# Patient Record
Sex: Male | Born: 1990 | Race: White | Hispanic: No | Marital: Married | Smoking: Never smoker
Health system: Southern US, Community
[De-identification: ages and names within clinical notes are randomized; demographics above are authoritative.]

## PROBLEM LIST (undated history)

## (undated) HISTORY — PX: WISDOM TOOTH EXTRACTION: SHX21

---

## 2010-12-18 ENCOUNTER — Encounter: Payer: Self-pay | Admitting: Emergency Medicine

## 2010-12-18 ENCOUNTER — Inpatient Hospital Stay (INDEPENDENT_AMBULATORY_CARE_PROVIDER_SITE_OTHER)
Admission: RE | Admit: 2010-12-18 | Discharge: 2010-12-18 | Disposition: A | Discharge status: Home / Self Care | Payer: 59 | Source: Ambulatory Visit | Attending: Emergency Medicine | Admitting: Emergency Medicine

## 2010-12-18 DIAGNOSIS — L255 Unspecified contact dermatitis due to plants, except food: Secondary | ICD-10-CM | POA: Insufficient documentation

## 2011-05-11 NOTE — Progress Notes (Signed)
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  The next day he broke out in a rash under his R eye and on L arm and neck.  Itchy and red and a little swollen near the eye.  No visual problems or actual eye redness, no F/C.  Hasn't used any meds yet.  He is going to a concert in [place] this weekend and would like to be better by then.  REVIEW OF SYSTEMS Constitutional Symptoms      Denies fever, chills, night sweats, weight loss, weight gain, and fatigue.  Eyes       Denies change in vision, eye pain, eye discharge, glasses, contact lenses, and eye surgery. Ear/Nose/Throat/Mouth       Denies hearing loss/aids, change in hearing, ear pain, ear discharge, dizziness, frequent runny nose, frequent nose bleeds, sinus problems, sore throat, hoarseness, and tooth pain or bleeding.  Respiratory       Denies dry cough, productive cough, wheezing, shortness of breath, asthma, bronchitis, and emphysema/COPD.  Cardiovascular       Denies murmurs, chest pain, and tires easily with exhertion.    Gastrointestinal       Denies stomach pain, nausea/vomiting, diarrhea, constipation, blood in bowel movements, and indigestion. Genitourniary       Denies painful urination, blood or discharge from penis, kidney stones, and loss of urinary control. Neurological  Denies paralysis, seizures, and fainting/blackouts. Musculoskeletal       Denies muscle pain, joint pain, joint stiffness, decreased range of motion, redness, swelling, muscle weakness, and gout.  Skin       Denies bruising, unusual mles/lumps or sores, and hair/skin or nail changes.  Psych       Denies mood changes, temper/anger issues, anxiety/stress, speech problems, depression, and sleep problems. Other Comments: ?poison ivy around right eye and right arm x 3 days   Past History:  Past Medical History: Unremarkable  Past Surgical History: Denies surgical history  Family History: NA  Social History: Occupation: Theatre stage manager Never Smoked Alcohol use-no Drug use-no Smoking Status:  never Drug Use:  no Physical Exam General appearance: well developed, well nourished, no acute distress MSE: oriented to time, place, and person Scattered linear raised excoriations on R volar forearm, lower neck, and inferior to R eye.  No eye involvement or conjunctival injection. There is also mild swelling inferior to the eye, but no signs of orbital cellulitis.  OP is patent, lungs CTAB.  Assessment New Problems: POISON IVY DERMATITIS (ICD-692.6)   Plan New Medications/Changes: PREDNISONE 20 MG TABS (PREDNISONE) 1 by mouth two  times a day for5  #10 x 0, 12/18/2010, Hoyt Koch MD  New Orders: New Patient Level III [phone] Solumedrol up to 125mg  [J2930] Admin of Therapeutic Inj  intramuscular or subcutaneous [96372] Planning Comments:   Solumedrol given since poison ivy near the eye, then Rx for prednisone for 4.5 - 5 days.  Cool compresses, oral Benedryl, and avoid scratching.  Follow-up with your primary care physician or dermatology if not improving or if getting worse   The patient and/or caregiver has been counseled thoroughly with regard to medications prescribed including dosage, schedule, interactions, rationale for use, and possible side effects and they verbalize  understanding.  Diagnoses and expected course of recovery discussed and will return if not improved as expected or if the condition worsens. Patient and/or caregiver verbalized understanding.  Prescriptions: PREDNISONE 20 MG TABS (PREDNISONE) 1 by mouth two times a day for5  #10 x 0   Entered and Authorized by:   Hoyt Koch MD   Signed by:   Hoyt Koch MD on 12/18/2010   Method used:   Print then Give to Patient   RxID:   [id number]   Medication Administration  Injection # 1:    Medication: Solumedrol up to 125mg     Diagnosis: POISON IVY DERMATITIS (ICD-692.6)    Route: IM    Site: RUOQ gluteus    Exp Date: 06/08/2013    Lot #: [id number]    Mfr: Pharmacia    Patient tolerated injection without complications    Given by: Lajean Saver RN (December 18, 2010 11:06 AM)  Orders Added: 1)  New Patient Level III [99203] 2)  Solumedrol up to 125mg  [J2930] 3)  Admin of Therapeutic Inj  intramuscular or subcutaneous [57846]

## 2013-10-18 ENCOUNTER — Emergency Department
Admission: EM | Admit: 2013-10-18 | Discharge: 2013-10-18 | Disposition: A | Discharge status: Home / Self Care | Payer: 59 | Source: Home / Self Care | Attending: Family Medicine | Admitting: Family Medicine

## 2013-10-18 ENCOUNTER — Encounter: Payer: Self-pay | Admitting: Emergency Medicine

## 2013-10-18 DIAGNOSIS — L255 Unspecified contact dermatitis due to plants, except food: Secondary | ICD-10-CM

## 2013-10-18 MED ORDER — TRIAMCINOLONE ACETONIDE 40 MG/ML IJ SUSP
40.0000 mg | Freq: Once | INTRAMUSCULAR | Status: AC
  Administered 2013-10-18: 40 mg via INTRAMUSCULAR

## 2013-10-18 NOTE — Discharge Instructions (Signed)
May take Benadryl at bedtime as needed for itching.   Poison [employer] Poison ivy is a inflammation of the skin (contact dermatitis) caused by touching the allergens on the leaves of the ivy plant following previous exposure to the plant. The rash usually appears 48 hours after exposure. The rash is usually bumps (papules) or blisters (vesicles) in a linear pattern. Depending on your own sensitivity, the rash may simply cause redness and itching, or it may also progress to blisters which may break open. These must be well cared for to prevent secondary bacterial (germ) infection, followed by scarring. Keep any open areas dry, clean, dressed, and covered with an antibacterial ointment if needed. The eyes may also get puffy. The puffiness is worst in the morning and gets better as the day progresses. This dermatitis usually heals without scarring, within 2 to 3 weeks without treatment. HOME CARE INSTRUCTIONS  Thoroughly wash with soap and water as soon as you have been exposed to poison ivy. You have about one half hour to remove the plant resin before it will cause the rash. This washing will destroy the oil or antigen on the skin that is causing, or will cause, the rash. Be sure to wash under your fingernails as any plant resin there will continue to spread the rash. Do not rub skin vigorously when washing affected area. Poison ivy cannot spread if no oil from the plant remains on your body. A rash that has progressed to weeping sores will not spread the rash unless you have not washed thoroughly. It is also important to wash any clothes you have been wearing as these may carry active allergens. The rash will return if you wear the unwashed clothing, even several days later. Avoidance of the plant in the future is the best measure. Poison ivy plant can be recognized by the number of leaves. Generally, poison ivy has three leaves with flowering branches on a single stem. Diphenhydramine may be purchased over the  counter and used as needed for itching. Do not drive with this medication if it makes you drowsy.Ask your caregiver about medication for children. SEEK MEDICAL CARE IF:  Open sores develop.  Redness spreads beyond area of rash.  You notice purulent (pus-like) discharge.  You have increased pain.  Other signs of infection develop (such as fever). Document Released: 05/22/2000 Document Revised: 08/17/2011 Document Reviewed: 04/10/2009 [facility] Patient Information 2014 [employer] [place].

## 2013-10-18 NOTE — ED Notes (Signed)
Brad c/o poison oak rash to bilateral arms x 5 days. Applied Clorox.

## 2013-10-18 NOTE — ED Provider Notes (Signed)
CSN: [id number]     Arrival date & time 10/18/13  1622 History   First MD Initiated Contact with Patient 10/18/13 1648     Chief Complaint  Patient presents with  . Poison Oak      HPI Comments: Patient came into contact with poison ivy one week ago, and has persistent pruritic rash on arms.  Patient is a 23 y.o. male presenting with rash. The history is provided by the patient.  Rash Pain location: arms. Pain quality comment:  Itching Pain severity:  Mild Onset quality:  Gradual Duration:  1 week Timing:  Constant Progression:  Improving Chronicity:  New Context comment:  Poison ivy contact Relieved by:  Nothing Worsened by:  Nothing tried Ineffective treatments: Clorox. Associated symptoms: no chills, no fever and no sore throat     History reviewed. No pertinent past medical history. Past Surgical History  Procedure Laterality Date  . Wisdom tooth extraction     History reviewed. No pertinent family history. History  Substance Use Topics  . Smoking status: Never Smoker   . Smokeless tobacco: Current User    Types: Snuff  . Alcohol Use: Yes    Review of Systems  Constitutional: Negative for fever and chills.  HENT: Negative for sore throat.   Skin: Positive for rash.    Allergies  Review of patient's allergies indicates no known allergies.  Home Medications   Prior to Admission medications   Not on File   BP 116/67  Pulse 57  Temp(Src) 98.2 F (36.8 C) (Oral)  Resp 14  Wt 171 lb (77.565 kg)  SpO2 97% Physical Exam  Nursing note and vitals reviewed. Constitutional: He is oriented to person, place, and time. He appears well-developed and well-nourished. No distress.  HENT:  Head: Atraumatic.  Eyes: Conjunctivae are normal. Pupils are equal, round, and reactive to light.  Neurological: He is alert and oriented to person, place, and time.  Skin: Skin is warm and dry. Rash noted.     There is erythema on both arms, scattered small vesicles, and  areas of linear erythema as noted on diagram.      ED Course  Procedures  none      MDM   1. Rhus dermatitis    Kenalog 40mg  IM  May take Benadryl at bedtime as needed for itching. Call if not improved about 4 days (could add prednisone)    Lattie HawStephen A Beese, MD 10/18/13 1705

## 2014-08-06 ENCOUNTER — Emergency Department
Admission: EM | Admit: 2014-08-06 | Discharge: 2014-08-06 | Disposition: A | Discharge status: Home / Self Care | Payer: 59 | Source: Home / Self Care | Attending: Family Medicine | Admitting: Family Medicine

## 2014-08-06 ENCOUNTER — Encounter: Payer: Self-pay | Admitting: *Deleted

## 2014-08-06 DIAGNOSIS — J209 Acute bronchitis, unspecified: Secondary | ICD-10-CM

## 2014-08-06 MED ORDER — AZITHROMYCIN 250 MG PO TABS: ORAL_TABLET | ORAL | Status: DC

## 2014-08-06 NOTE — ED Provider Notes (Signed)
CSN: [id number]     Arrival date & time 08/06/14  0831 History   First MD Initiated Contact with Patient 08/06/14 [phone]     Chief Complaint  Patient presents with  . Hoarse  . Cough      HPI Comments: Two weeks ago patient developed typical cold-like symptoms including mild sore throat, sinus congestion, headache, and fatigue, but no cough.  These symptoms almost cleared, and then one week ago recurred with development of a non-productive cough.  No fevers, chills, and sweats.   The history is provided by the patient.    History reviewed. No pertinent past medical history. Past Surgical History  Procedure Laterality Date  . Wisdom tooth extraction     History reviewed. No pertinent family history. History  Substance Use Topics  . Smoking status: Never Smoker   . Smokeless tobacco: Current User    Types: Snuff  . Alcohol Use: Yes    Review of Systems + sore throat + cough No pleuritic pain No wheezing + nasal congestion + post-nasal drainage No sinus pain/pressure No itchy/red eyes No earache No hemoptysis No SOB No fever/chills No nausea No vomiting No abdominal pain No diarrhea No urinary symptoms No skin rash No fatigue No myalgias No headache Used OTC meds without relief  Allergies  Review of patient's allergies indicates no known allergies.  Home Medications   Prior to Admission medications   Medication Sig Start Date End Date Taking? Authorizing Provider  azithromycin (ZITHROMAX Z-PAK) 250 MG tablet Take 2 tabs today; then begin one tab once daily for 4 more days. 08/06/14   Lattie HawStephen A George Haggart, MD   BP 119/68 mmHg  Pulse 66  Temp(Src) 97.9 F (36.6 C) (Oral)  Resp 16  Ht 5\' 10"  (1.778 m)  Wt 182 lb (82.555 kg)  BMI 26.11 kg/m2  SpO2 98% Physical Exam Nursing notes and Vital Signs reviewed. Appearance:  Patient appears stated age, and in no acute distress Eyes:  Pupils are equal, round, and reactive to light and accomodation.  Extraocular movement  is intact.  Conjunctivae are not inflamed  Ears:  Canals normal.  Tympanic membranes normal.  Nose:  Mildly congested turbinates.  No sinus tenderness.    Pharynx:  Normal Neck:  Supple.  Nontender enlarged posterior nodes are palpated bilaterally  Lungs:  Clear to auscultation.  Breath sounds are equal.  Heart:  Regular rate and rhythm without murmurs, rubs, or gallops.  Abdomen:  Nontender without masses or hepatosplenomegaly.  Bowel sounds are present.  No CVA or flank tenderness.  Extremities:  No edema.  No calf tenderness Skin:  No rash present.   ED Course  Procedures  none   MDM   1. Acute bronchitis, unspecified organism    Begin Z-pack for atypical coverage. Take plain guaifenesin (1200mg  extended release tabs such as Mucinex) twice daily, with plenty of water, for cough and congestion.  May add Pseudoephedrine for sinus congestion.  Get adequate rest.   May use Afrin nasal spray (or generic oxymetazoline) twice daily for about 5 days.  Also recommend using saline nasal spray several times daily and saline nasal irrigation (AYR is a common brand).  May take Delsym Cough Suppressant at bedtime for nighttime cough.  Stop all antihistamines for now, and other non-prescription cough/cold preparations.   Follow-up with family doctor if not improving about one week.     Lattie HawStephen A Rocquel Askren, MD 08/06/14 [phone]

## 2014-08-06 NOTE — Discharge Instructions (Signed)
Take plain guaifenesin (1200mg  extended release tabs such as Mucinex) twice daily, with plenty of water, for cough and congestion.  May add Pseudoephedrine for sinus congestion.  Get adequate rest.   May use Afrin nasal spray (or generic oxymetazoline) twice daily for about 5 days.  Also recommend using saline nasal spray several times daily and saline nasal irrigation (AYR is a common brand).  May take Delsym Cough Suppressant at bedtime for nighttime cough.  Stop all antihistamines for now, and other non-prescription cough/cold preparations.   Follow-up with family doctor if not improving about one week.

## 2014-08-06 NOTE — ED Notes (Signed)
Pt c/o hoarseness with head and chest congestion x 2 wks. Denies fever. He has taken Mucinex or Alka Seltzer with minimal relief.

## 2017-08-30 ENCOUNTER — Ambulatory Visit: Payer: Managed Care, Other (non HMO) | Admitting: Sports Medicine

## 2017-08-30 ENCOUNTER — Encounter: Payer: Self-pay | Admitting: Sports Medicine

## 2017-08-30 ENCOUNTER — Ambulatory Visit (INDEPENDENT_AMBULATORY_CARE_PROVIDER_SITE_OTHER): Payer: Managed Care, Other (non HMO)

## 2017-08-30 DIAGNOSIS — M25561 Pain in right knee: Secondary | ICD-10-CM | POA: Diagnosis not present

## 2017-08-30 DIAGNOSIS — M25562 Pain in left knee: Secondary | ICD-10-CM | POA: Diagnosis not present

## 2017-08-30 MED ORDER — MELOXICAM 15 MG PO TABS
ORAL_TABLET | ORAL | 3 refills | Status: DC
Start: 2017-08-30 — End: 2017-12-01

## 2017-08-30 NOTE — Progress Notes (Signed)
Subjective:    I'm seeing this patient as a consultation for: David Rivera  CC: Right knee pain  HPI: This is a pleasant 27 year old male, [place] [employer], for the past 3 weeks he said increasing pain in the posterior aspect of his right knee, worse after CrossFit sessions which he has since stopped.  It is worsened by deep knee flexion, no catching, locking or other mechanical symptoms, no buckling, no swelling.  Mild, persistent without radiation.  I reviewed the past medical history, family history, social history, surgical history, and allergies today and no changes were needed.  Please see the problem list section below in epic for further details.  Past Medical History: No past medical history on file. Past Surgical History: Past Surgical History:  Procedure Laterality Date  . WISDOM TOOTH EXTRACTION     Social History: Social History   Socioeconomic History  . Marital status: Married    Spouse name: Not on file  . Number of children: Not on file  . Years of education: Not on file  . Highest education level: Not on file  Occupational History  . Not on file  Social Needs  . Financial resource strain: Not on file  . Food insecurity:    Worry: Not on file    Inability: Not on file  . Transportation needs:    Medical: Not on file    Non-medical: Not on file  Tobacco Use  . Smoking status: Never Smoker  . Smokeless tobacco: Current User    Types: Snuff  Substance and Sexual Activity  . Alcohol use: Yes  . Drug use: Not on file  . Sexual activity: Not on file  Lifestyle  . Physical activity:    Days per week: Not on file    Minutes per session: Not on file  . Stress: Not on file  Relationships  . Social connections:    Talks on phone: Not on file    Gets together: Not on file    Attends religious service: Not on file    Active member of club or organization: Not on file    Attends meetings of clubs or organizations: Not on  file    Relationship status: Not on file  Other Topics Concern  . Not on file  Social History Narrative  . Not on file   Family History: No family history on file. Allergies: No Known Allergies Medications: See med rec.  Review of Systems: No headache, visual changes, nausea, vomiting, diarrhea, constipation, dizziness, abdominal pain, skin rash, fevers, chills, night sweats, weight loss, swollen lymph nodes, body aches, joint swelling, muscle aches, chest pain, shortness of breath, mood changes, visual or auditory hallucinations.   Objective:   General: Well Developed, well nourished, and in no acute distress.  Neuro:  Extra-ocular muscles intact, able to move all 4 extremities, sensation grossly intact.  Deep tendon reflexes tested were normal. Psych: Alert and oriented, mood congruent with affect. ENT:  Ears and nose appear unremarkable.  Hearing grossly normal. Neck: Unremarkable overall appearance, trachea midline.  No visible thyroid enlargement. Eyes: Conjunctivae and lids appear unremarkable.  Pupils equal and round. Skin: Warm and dry, no rashes noted.  Cardiovascular: Pulses palpable, no extremity edema. Right knee: Normal to inspection with no erythema or effusion or obvious bony abnormalities. Palpation normal with no warmth or joint line tenderness or patellar tenderness or condyle tenderness. ROM normal in flexion and extension and lower leg rotation. Reproduction of  pain with terminal flexion. Ligaments with solid consistent endpoints including ACL, PCL, LCL, MCL. Negative Mcmurray's and provocative meniscal tests. Non painful patellar compression. Patellar and quadriceps tendons unremarkable. Hamstring and quadriceps strength is normal.  Impression and Recommendations:   This case required medical decision making of moderate complexity.  Acute pain of right knee Suspect meniscal strain. No effusion, exam is benign with the exception of pain with terminal  flexion. He will hold off on Cross-Fit for now, continue his sleep, adding meloxicam, home rehab exercises. 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 Baseline x-rays. Return to see me in a month. ___________________________________________ David Rivera. Benjamin Stain, M.D., ABFM., CAQSM. Primary Care and Sports Medicine [facility]  Adjunct Instructor of Family Medicine  [employer]

## 2017-08-30 NOTE — Assessment & Plan Note (Signed)
Suspect meniscal strain. No effusion, exam is benign with the exception of pain with terminal flexion. He will hold off on Cross-Fit for now, continue his sleep, adding meloxicam, home rehab exercises. 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 Baseline x-rays. Return to see me in a month.

## 2017-09-30 ENCOUNTER — Ambulatory Visit: Payer: Managed Care, Other (non HMO) | Admitting: Sports Medicine

## 2017-10-01 ENCOUNTER — Encounter: Payer: Self-pay | Admitting: Sports Medicine

## 2017-10-01 ENCOUNTER — Ambulatory Visit (INDEPENDENT_AMBULATORY_CARE_PROVIDER_SITE_OTHER): Payer: Managed Care, Other (non HMO) | Admitting: Sports Medicine

## 2017-10-01 DIAGNOSIS — M25561 Pain in right knee: Secondary | ICD-10-CM | POA: Diagnosis not present

## 2017-10-01 NOTE — Assessment & Plan Note (Signed)
Continue to suspect meniscal injury, posterior lateral. He did have some pain with resisted knee flexion suspicious for a biceps femoris injury. Considering mechanical symptoms, persistent pain we are going to proceed with an MRI. Continue knee sleeve. Return for MRI results. Works for the [employer], no restrictions for now.   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

## 2017-10-01 NOTE — Progress Notes (Signed)
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  He still has a bit of pain, as well as catching and mechanical symptoms in the posterior lateral joint line.  He was able to run today without any pain but it is prolonged and deep flexion that creates his posterior knee pain.  I reviewed the past medical history, family history, social history, surgical history, and allergies today and no changes were needed.  Please see the problem list section below in epic for further details.  Past Medical History: No past medical history on file. Past Surgical History: Past Surgical History:  Procedure Laterality Date  . WISDOM TOOTH EXTRACTION     Social History: Social History   Socioeconomic History  . Marital status: Married    Spouse name: Not on file  . Number of children: Not on file  . Years of education: Not on file  . Highest education level: Not on file  Occupational History  . Not on file  Social Needs  . Financial resource strain: Not on file  . Food insecurity:    Worry: Not on file    Inability: Not on file  . Transportation needs:    Medical: Not on file    Non-medical: Not on file  Tobacco Use  . Smoking status: Never Smoker  . Smokeless tobacco: Current User    Types: Snuff  Substance and Sexual Activity  . Alcohol use: Yes  . Drug use: Not on file  . Sexual activity: Not on file  Lifestyle  . Physical activity:    Days per week: Not on file    Minutes per session: Not on file  . Stress: Not on file  Relationships  . Social connections:    Talks on phone: Not on file    Gets together: Not on file    Attends religious service: Not on file    Active member of club or organization: Not on file    Attends  meetings of clubs or organizations: Not on file    Relationship status: Not on file  Other Topics Concern  . Not on file  Social History Narrative  . Not on file   Family History: No family history on file. Allergies: No Known Allergies Medications: See med rec.  Review of Systems: No fevers, chills, night sweats, weight loss, chest pain, or shortness of breath.   Objective:    General: Well Developed, well nourished, and in no acute distress.  Neuro: Alert and oriented x3, extra-ocular muscles intact, sensation grossly intact.  HEENT: Normocephalic, atraumatic, pupils equal round reactive to light, neck supple, no masses, no lymphadenopathy, thyroid nonpalpable.  Skin: Warm and dry, no rashes. Cardiac: Regular rate and rhythm, no murmurs rubs or gallops, no lower extremity edema.  Respiratory: Clear to auscultation bilaterally. Not using accessory muscles, speaking in full sentences. Right knee: Normal to inspection with no erythema or effusion or obvious bony abnormalities. Palpation normal with no warmth or joint line tenderness or patellar tenderness or condyle tenderness. ROM normal in flexion and extension and lower leg rotation. Ligaments with solid consistent endpoints including ACL, PCL, LCL, MCL. Positive McMurray's test with pain but no clinical catch, pain with terminal flexion of the knee Non painful patellar compression. Patellar and quadriceps tendons  unremarkable. Hamstring and quadriceps strength is normal, he did have some reproduction of pain with resisted flexion of the right knee.  Impression and Recommendations:    Acute pain of right knee Continue to suspect meniscal injury, posterior lateral. He did have some pain with resisted knee flexion suspicious for a biceps femoris injury. Considering mechanical symptoms, persistent pain we are going to proceed with an MRI. Continue knee sleeve. Return for MRI results. Works for the [employer], no  restrictions for now.   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 ___________________________________________ Ihor Austinhomas J. Benjamin Stainhekkekandam, M.D., ABFM., CAQSM. Primary Care and Sports Medicine  [facility]  Adjunct Instructor of Family Medicine  [employer]

## 2017-10-25 ENCOUNTER — Ambulatory Visit (INDEPENDENT_AMBULATORY_CARE_PROVIDER_SITE_OTHER): Payer: Managed Care, Other (non HMO)

## 2017-10-25 DIAGNOSIS — M25561 Pain in right knee: Secondary | ICD-10-CM

## 2017-11-03 ENCOUNTER — Ambulatory Visit (INDEPENDENT_AMBULATORY_CARE_PROVIDER_SITE_OTHER): Payer: Managed Care, Other (non HMO) | Admitting: Sports Medicine

## 2017-11-03 ENCOUNTER — Encounter: Payer: Self-pay | Admitting: Sports Medicine

## 2017-11-03 DIAGNOSIS — M25561 Pain in right knee: Secondary | ICD-10-CM | POA: Diagnosis not present

## 2017-11-03 NOTE — Progress Notes (Signed)
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  We treated him conservatively, no improvement so we proceeded with a MRI, the results of which will be dictated below.  Pain is moderate, persistent, localized without radiation.  I reviewed the past medical history, family history, social history, surgical history, and allergies today and no changes were needed.  Please see the problem list section below in epic for further details.  Past Medical History: No past medical history on file. Past Surgical History: Past Surgical History:  Procedure Laterality Date  . WISDOM TOOTH EXTRACTION     Social History: Social History   Socioeconomic History  . Marital status: Married    Spouse name: Not on file  . Number of children: Not on file  . Years of education: Not on file  . Highest education level: Not on file  Occupational History  . Not on file  Social Needs  . Financial resource strain: Not on file  . Food insecurity:    Worry: Not on file    Inability: Not on file  . Transportation needs:    Medical: Not on file    Non-medical: Not on file  Tobacco Use  . Smoking status: Never Smoker  . Smokeless tobacco: Current User    Types: Snuff  Substance and Sexual Activity  . Alcohol use: Yes  . Drug use: Not on file  . Sexual activity: Not on file  Lifestyle  . Physical activity:    Days per week: Not on file    Minutes per session: Not on file  . Stress: Not on file  Relationships  . Social connections:    Talks on phone: Not on file    Gets together: Not on file    Attends religious service: Not on file    Active member of club or organization: Not on file    Attends meetings of clubs or organizations: Not on file    Relationship status: Not on file  Other Topics Concern  . Not on file  Social History Narrative  . Not on  file   Family History: No family history on file. Allergies: No Known Allergies Medications: See med rec.  Review of Systems: No fevers, chills, night sweats, weight loss, chest pain, or shortness of breath.   Objective:    General: Well Developed, well nourished, and in no acute distress.  Neuro: Alert and oriented x3, extra-ocular muscles intact, sensation grossly intact.  HEENT: Normocephalic, atraumatic, pupils equal round reactive to light, neck supple, no masses, no lymphadenopathy, thyroid nonpalpable.  Skin: Warm and dry, no rashes. Cardiac: Regular rate and rhythm, no murmurs rubs or gallops, no lower extremity edema.  Respiratory: Clear to auscultation bilaterally. Not using accessory muscles, speaking in full sentences. Right Knee: Normal to inspection with no erythema or effusion or obvious bony abnormalities. Palpation normal with no warmth or joint line tenderness or patellar tenderness or condyle tenderness. ROM normal in flexion and extension and lower leg rotation. Ligaments with solid consistent endpoints including ACL, PCL, LCL, MCL. Negative Mcmurray's and provocative meniscal tests. Non painful patellar compression. Patellar and quadriceps tendons unremarkable. Hamstring and quadriceps strength is normal.  MRI personally reviewed, increased T2 signal in the PCL without any evidence of a tear, everything else looks normal.  Procedure: Real-time Ultrasound Guided Injection of right knee Device:  GE Logiq E  Verbal informed consent obtained.  Time-out conducted.  Noted no overlying erythema, induration, or other signs of local infection.  Skin prepped in a sterile fashion.  Local anesthesia: Topical Ethyl chloride.  With sterile technique and under real time ultrasound guidance: 1 cc Kenalog 40, 2 cc lidocaine, 2 cc bupivacaine injected easily Completed without difficulty  Pain immediately resolved suggesting accurate placement of the medication.  Advised to  call if fevers/chills, erythema, induration, drainage, or persistent bleeding.  Images permanently stored and available for review in the ultrasound unit.  Impression: Technically successful ultrasound guided injection.  Impression and Recommendations:    Acute pain of right knee MRI is normal with the exception of increased T2 signal in the PCL, no tears, exam is benign. Considering this we are going to try a knee joint injection, return to see me in 1 month. Next step would be arthroscopy. ___________________________________________ Ihor Austin. Benjamin Stain, M.D., ABFM., CAQSM. Primary Care and Sports Medicine [facility]  Adjunct Instructor of Family Medicine  [employer]

## 2017-11-03 NOTE — Assessment & Plan Note (Signed)
MRI is normal with the exception of increased T2 signal in the PCL, no tears, exam is benign. Considering this we are going to try a knee joint injection, return to see me in 1 month. Next step would be arthroscopy.

## 2017-12-01 ENCOUNTER — Encounter: Payer: Self-pay | Admitting: Sports Medicine

## 2017-12-01 ENCOUNTER — Ambulatory Visit: Payer: Managed Care, Other (non HMO) | Admitting: Sports Medicine

## 2017-12-01 DIAGNOSIS — M25561 Pain in right knee: Secondary | ICD-10-CM

## 2017-12-01 NOTE — Assessment & Plan Note (Signed)
Posterior pain essentially resolved after injection. 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 He will start some gentle home rehab, but no restrictions in activity, next step would be arthroscopy if pain recurs. 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

## 2017-12-01 NOTE — Progress Notes (Signed)
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  We did an injection and he returns today completely symptom-free.  He may have a bit of stiffness when sitting with his knees bent for a long period of time but this resolves with a few steps.  I reviewed the past medical history, family history, social history, surgical history, and allergies today and no changes were needed.  Please see the problem list section below in epic for further details.  Past Medical History: No past medical history on file. Past Surgical History: Past Surgical History:  Procedure Laterality Date  . WISDOM TOOTH EXTRACTION     Social History: Social History   Socioeconomic History  . Marital status: Married    Spouse name: Not on file  . Number of children: Not on file  . Years of education: Not on file  . Highest education level: Not on file  Occupational History  . Not on file  Social Needs  . Financial resource strain: Not on file  . Food insecurity:    Worry: Not on file    Inability: Not on file  . Transportation needs:    Medical: Not on file    Non-medical: Not on file  Tobacco Use  . Smoking status: Never Smoker  . Smokeless tobacco: Current User    Types: Snuff  Substance and Sexual Activity  . Alcohol use: Yes  . Drug use: Not on file  . Sexual activity: Not on file  Lifestyle  . Physical activity:    Days per week: Not on file    Minutes per session: Not on file  . Stress: Not on file  Relationships  . Social connections:    Talks on phone: Not on file    Gets together: Not on file    Attends religious service: Not on file    Active member of club or organization: Not on file    Attends meetings of clubs or organizations: Not on file    Relationship status: Not on file  Other Topics Concern  .  Not on file  Social History Narrative  . Not on file   Family History: No family history on file. Allergies: No Known Allergies Medications: See med rec.  Review of Systems: No fevers, chills, night sweats, weight loss, chest pain, or shortness of breath.   Objective:    General: Well Developed, well nourished, and in no acute distress.  Neuro: Alert and oriented x3, extra-ocular muscles intact, sensation grossly intact.  HEENT: Normocephalic, atraumatic, pupils equal round reactive to light, neck supple, no masses, no lymphadenopathy, thyroid nonpalpable.  Skin: Warm and dry, no rashes. Cardiac: Regular rate and rhythm, no murmurs rubs or gallops, no lower extremity edema.  Respiratory: Clear to auscultation bilaterally. Not using accessory muscles, speaking in full sentences. Right knee: Normal to inspection with no erythema or effusion or obvious bony abnormalities. Palpation normal with no warmth or joint line tenderness or patellar tenderness or condyle tenderness. ROM normal in flexion and extension and lower leg rotation. Ligaments with solid consistent endpoints including ACL, PCL, LCL, MCL. Negative Mcmurray's and provocative meniscal tests. Non painful patellar compression. Patellar and quadriceps tendons unremarkable. Hamstring and quadriceps strength is normal.  Impression and Recommendations:    Acute pain of right knee Posterior pain essentially resolved  after injection. 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 He will start some gentle home rehab, but no restrictions in activity, next step would be arthroscopy if pain recurs. 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  ___________________________________________ Ihor Austin. Benjamin Stain, M.D., ABFM., CAQSM. Primary Care and Sports Medicine [facility]  Adjunct Instructor of Family Medicine  [employer]

## 2018-05-02 ENCOUNTER — Ambulatory Visit (INDEPENDENT_AMBULATORY_CARE_PROVIDER_SITE_OTHER): Payer: Managed Care, Other (non HMO)

## 2018-05-02 ENCOUNTER — Ambulatory Visit (INDEPENDENT_AMBULATORY_CARE_PROVIDER_SITE_OTHER): Payer: Managed Care, Other (non HMO) | Admitting: Sports Medicine

## 2018-05-02 DIAGNOSIS — M25561 Pain in right knee: Secondary | ICD-10-CM | POA: Diagnosis not present

## 2018-05-02 DIAGNOSIS — G8929 Other chronic pain: Secondary | ICD-10-CM | POA: Diagnosis not present

## 2018-05-02 NOTE — Assessment & Plan Note (Addendum)
Pain had initially resolved after an injection about 6 months ago. 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 Had an episode of exquisite pain with walking. Still has some difficulty with kneeling. Because of this I am going to be looking for an occult meniscal tear, it was not seen on the previous MRI so this time we will going to do an MR arthrogram of the knee. He will return at 2:45pm for the arthrogram injection and an MRI at 3:45pm today.  MRI was read mostly negative by radiology, I discussed this with Dr. Jena GaussMaxwell, there is quite possibly a tear in the free edge of the posterior horn of the medial meniscus on my personal read which would explain symptoms.  He does continue to have some signal in the PCL which likely represents strain/partial tearing but should not really give the mechanical symptoms.  Because I put steroid in the injection I would like to give this 1 more month of conservative treatment and if any recurrence of symptoms we will have Dr. Everardo PacificVarkey look at him for discussion of arthroscopy.

## 2018-05-02 NOTE — Progress Notes (Signed)
   Procedure: Real-time Ultrasound Guided gadolinium contrast injection of right knee Device: GE Logiq E  Verbal informed consent obtained.  Time-out conducted.  Noted no overlying erythema, induration, or other signs of local infection.  Skin prepped in a sterile fashion.  Local anesthesia: Topical Ethyl chloride.  With sterile technique and under real time ultrasound guidance: 1 cc kenalog 40, 2 cc lidocaine, 2 cc bupivacaine injected, syringe switched and 0.1 cc gadolinium injected, syringe again switched and 5 cc Isovue injected, syringe again switched and 12 cc sterile saline used to flush the needle. Joint visualized and capsule seen distending confirming intra-articular placement of contrast material and medication. Completed without difficulty  Advised to call if fevers/chills, erythema, induration, drainage, or persistent bleeding.  Images permanently stored and available for review in the ultrasound unit.  Impression: Technically successful ultrasound guided gadolinium contrast injection for MR arthrography.  Please see separate MR arthrogram report.

## 2018-05-02 NOTE — Progress Notes (Addendum)
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  Ultimately took a knee injection which resolved his pain.  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  He did well until recently, had an episode of his knee locking, he had exquisite pain, worse with terminal flexion.  No overt mechanical symptoms.  I reviewed the past medical history, family history, social history, surgical history, and allergies today and no changes were needed.  Please see the problem list section below in epic for further details.  Past Medical History: No past medical history on file. Past Surgical History: Past Surgical History:  Procedure Laterality Date  . WISDOM TOOTH EXTRACTION     Social History: Social History   Socioeconomic History  . Marital status: Married    Spouse name: Not on file  . Number of children: Not on file  . Years of education: Not on file  . Highest education level: Not on file  Occupational History  . Not on file  Social Needs  . Financial resource strain: Not on file  . Food insecurity:    Worry: Not on file    Inability: Not on file  . Transportation needs:    Medical: Not on file    Non-medical: Not on file  Tobacco Use  . Smoking status: Never Smoker  . Smokeless tobacco: Current User    Types: Snuff  Substance and Sexual Activity  . Alcohol use: Yes  . Drug use: Not on file  . Sexual activity: Not on file  Lifestyle  . Physical activity:    Days per week: Not on file    Minutes per session: Not on file  . Stress: Not on file  Relationships  . Social connections:    Talks on phone: Not on file    Gets together: Not on file    Attends religious service: Not on file    Active member of club or organization: Not on file    Attends meetings of clubs or organizations: Not on file    Relationship  status: Not on file  Other Topics Concern  . Not on file  Social History Narrative  . Not on file   Family History: No family history on file. Allergies: No Known Allergies Medications: See med rec.  Review of Systems: No fevers, chills, night sweats, weight loss, chest pain, or shortness of breath.   Objective:    General: Well Developed, well nourished, and in no acute distress.  Neuro: Alert and oriented x3, extra-ocular muscles intact, sensation grossly intact.  HEENT: Normocephalic, atraumatic, pupils equal round reactive to light, neck supple, no masses, no lymphadenopathy, thyroid nonpalpable.  Skin: Warm and dry, no rashes. Cardiac: Regular rate and rhythm, no murmurs rubs or gallops, no lower extremity edema.  Respiratory: Clear to auscultation bilaterally. Not using accessory muscles, speaking in full sentences. Right knee: Normal to inspection with no erythema or effusion or obvious bony abnormalities. Palpation normal with no warmth or joint line tenderness or patellar tenderness or condyle tenderness. ROM normal in flexion and extension and lower leg rotation. Ligaments with solid consistent endpoints including ACL, PCL, LCL, MCL. Negative Mcmurray's and provocative meniscal tests. Non painful patellar compression. Patellar and quadriceps tendons unremarkable. Hamstring and quadriceps strength is normal.  Impression and Recommendations:    Acute pain of right  knee Pain had initially resolved after an injection about 6 months ago. 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 Had an episode of exquisite pain with walking. Still has some difficulty with kneeling. Because of this I am going to be looking for an occult meniscal tear, it was not seen on the previous MRI so this time we will going to do an MR arthrogram of the knee. He will return at 2:45pm for the arthrogram injection and an MRI at 3:45pm today.  MRI was read mostly negative by radiology, I discussed  this with Dr. Jena GaussMaxwell, there is quite possibly a tear in the free edge of the posterior horn of the medial meniscus on my personal read which would explain symptoms.  He does continue to have some signal in the PCL which likely represents strain/partial tearing but should not really give the mechanical symptoms.  Because I put steroid in the injection I would like to give this 1 more month of conservative treatment and if any recurrence of symptoms we will have Dr. Everardo PacificVarkey look at him for discussion of arthroscopy. ___________________________________________ Ihor Austinhomas J. Benjamin Stainhekkekandam, M.D., ABFM., CAQSM. Primary Care and Sports Medicine [facility] [facility]  Adjunct Professor of Family Medicine  [employer]

## 2018-05-03 NOTE — Addendum Note (Signed)
Addended by: Monica BectonHEKKEKANDAM, Tariana Moldovan J on: 05/03/2018 10:04 AM   Modules accepted: Orders

## 2020-03-29 IMAGING — MR MR KNEE*R* W/O CM
6 series · 40 of 40 positions shown · non-contrast
Comparison: MRI dated 10/25/2017

CLINICAL DATA: Acute on chronic right knee pain. Locking, catching,
snapping, and crepitus.

EXAM:
MRI OF THE RIGHT KNEE WITHOUT CONTRAST (MR Arthrogram)
TECHNIQUE: Multiplanar, multisequence MR imaging of the knee was performed
following intra-articular contrast injection by Dr. Nor Elli. No
intravenous contrast was administered.

[Series 8: T2 fat-sat · sagittal · 4.0mm · 0.62mm/px · 4 of 25 slices shown (1 of 2)]
[im 1/25]
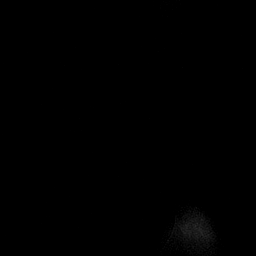
[im 9/25]
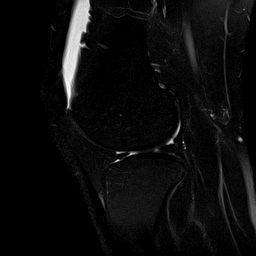
[im 17/25]
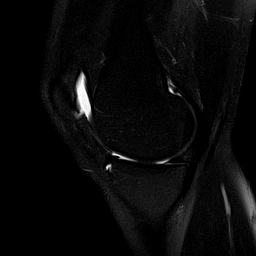
[im 25/25]
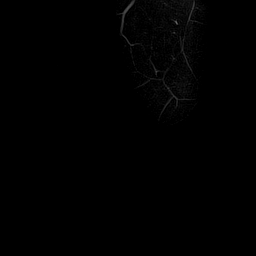

[Series 9: T1 fat-sat · coronal · 4.0mm · 0.62mm/px · 7 of 31 slices shown (1 of 2)]
[im 1/31]
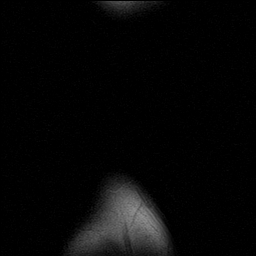
[im 6/31]
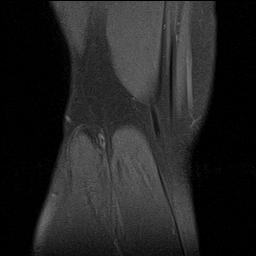
[im 11/31]
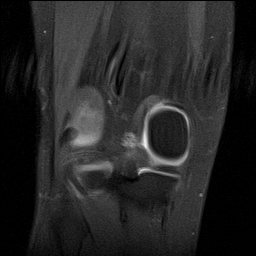
[im 16/31]
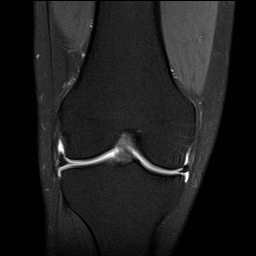
[im 21/31]
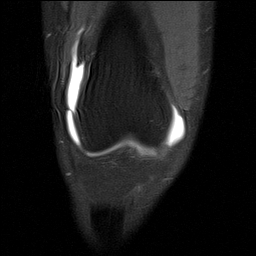
[im 26/31]
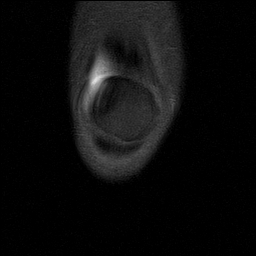
[im 31/31]
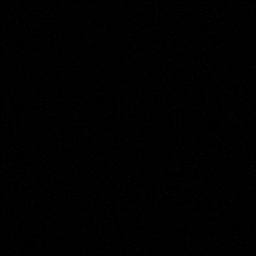

[Series 10: PD fat-sat · coronal · 4.0mm · 0.62mm/px · 7 of 31 slices shown]
[im 1/31]
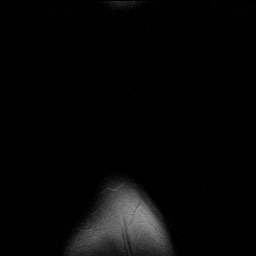
[im 6/31]
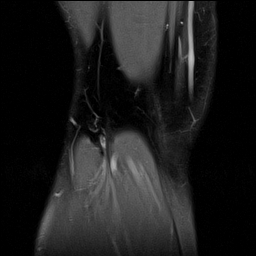
[im 11/31]
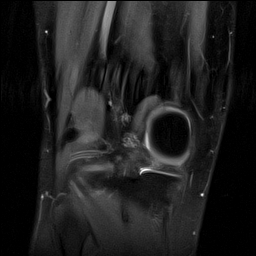
[im 16/31]
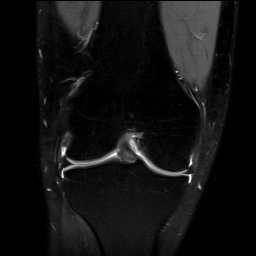
[im 21/31]
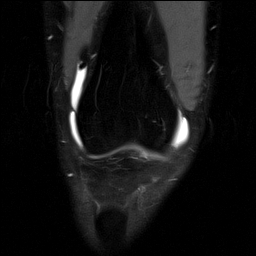
[im 26/31]
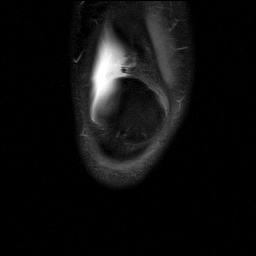
[im 31/31]
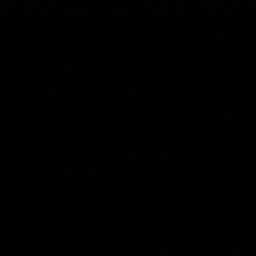

[Series 11: T2 fat-sat · coronal · 4.0mm · 0.62mm/px · 7 of 31 slices shown (2 of 2)]
[im 1/31]
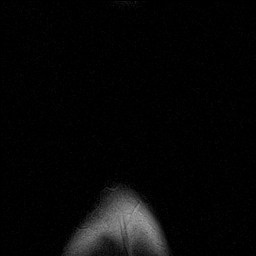
[im 6/31]
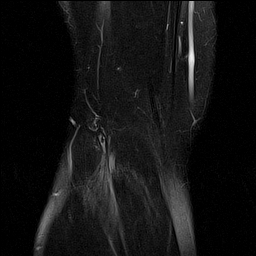
[im 11/31]
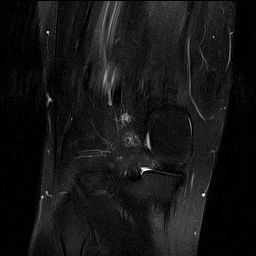
[im 16/31]
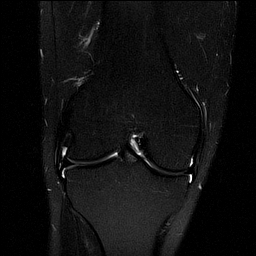
[im 21/31]
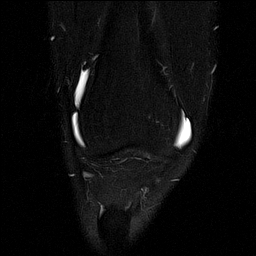
[im 26/31]
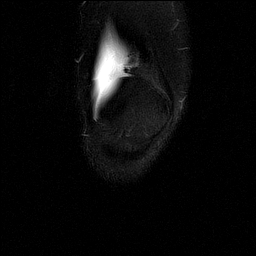
[im 31/31]
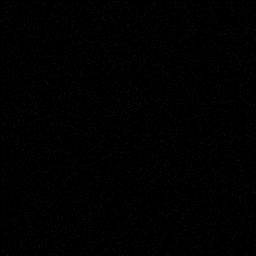

[Series 12: T1 · coronal · 4.0mm · 0.62mm/px · 7 of 31 slices shown]
[im 1/31]
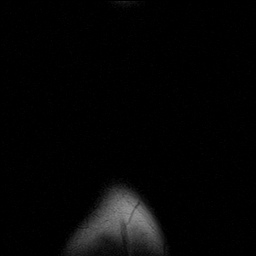
[im 6/31]
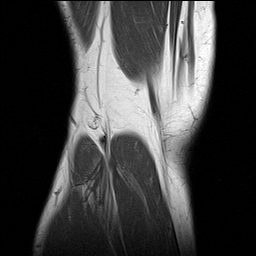
[im 11/31]
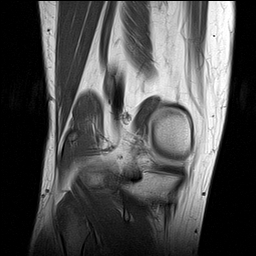
[im 16/31]
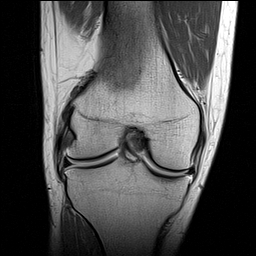
[im 21/31]
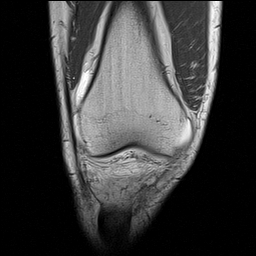
[im 26/31]
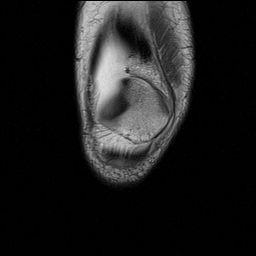
[im 31/31]
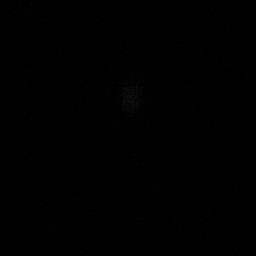

[Series 13: T1 fat-sat · axial · 4.0mm · 0.62mm/px · z∈[-103,+67]mm · 8 of 35 slices shown (2 of 2)]
[im 1/35]
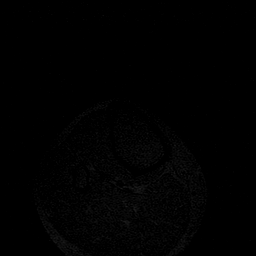
[im 5/35]
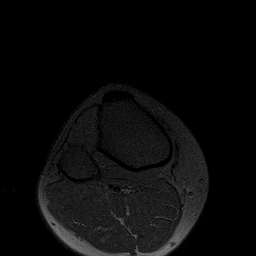
[im 10/35]
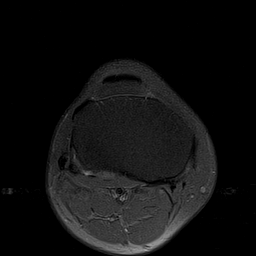
[im 15/35]
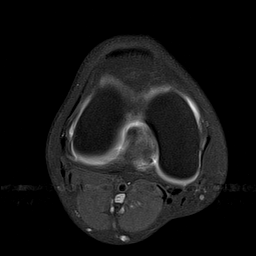
[im 20/35]
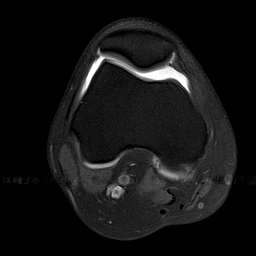
[im 25/35]
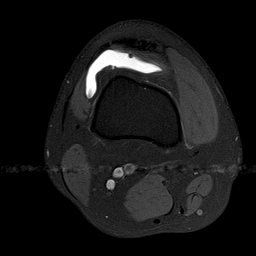
[im 30/35]
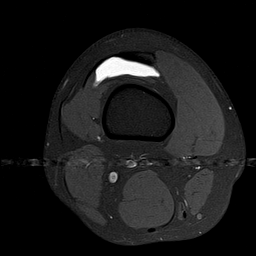
[im 35/35]
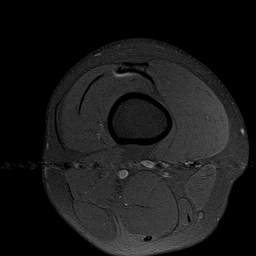

[40 of 40 positions shown; findings below may reference images not displayed]

FINDINGS: MENISCI

Medial meniscus:  Normal.

Lateral meniscus:  Normal.

LIGAMENTS

Cruciates:  The anterior cruciate ligament is normal.

Collaterals: Again noted is abnormal signal in the posterior
cruciate ligament with some contrast extending into the lateral
aspect of the ligament consistent with a partial tear, unchanged
since 10/25/2017.

CARTILAGE

Patellofemoral:  Normal.

Medial:  Normal.

Lateral:  Normal.

Joint:  Normal.  No plical thickening.

Popliteal Fossa:  No Baker cyst. Intact popliteus tendon.

Extensor Mechanism:  Normal.

Bones:  Normal.

Other: None
IMPRESSION: 1. Normal appearing menisci.
2. Chronic degenerative or posttraumatic changes of the posterior
cruciate ligament. There is a small amount contrast in the abnormal
area suggesting a previous partial tear. The appearance is unchanged
since 10/25/2017.
[DATE]. Otherwise, normal exam.  A
